# Patient Record
Sex: Female | Born: 1980 | Hispanic: Yes | Marital: Married | Smoking: Never smoker
Health system: Southern US, Community
[De-identification: ages and names within clinical notes are randomized; demographics above are authoritative.]

## PROBLEM LIST (undated history)

## (undated) DIAGNOSIS — R51 Headache: Secondary | ICD-10-CM

## (undated) DIAGNOSIS — R519 Headache, unspecified: Secondary | ICD-10-CM

## (undated) HISTORY — DX: Headache: R51

## (undated) HISTORY — PX: NO PAST SURGERIES: SHX2092

## (undated) HISTORY — DX: Headache, unspecified: R51.9

---

## 2013-07-18 ENCOUNTER — Emergency Department: Payer: Self-pay | Admitting: Emergency Medicine

## 2013-07-18 LAB — URINALYSIS, COMPLETE
BACTERIA: NONE SEEN
Bilirubin,UR: NEGATIVE
Glucose,UR: NEGATIVE mg/dL (ref 0–75)
KETONE: NEGATIVE
Nitrite: NEGATIVE
Ph: 5 (ref 4.5–8.0)
Protein: 30
Specific Gravity: 1.005 (ref 1.003–1.030)

## 2013-07-18 LAB — PREGNANCY, URINE: Pregnancy Test, Urine: NEGATIVE m[IU]/mL

## 2015-12-04 ENCOUNTER — Encounter: Payer: Self-pay | Admitting: Family Medicine

## 2015-12-04 ENCOUNTER — Ambulatory Visit (INDEPENDENT_AMBULATORY_CARE_PROVIDER_SITE_OTHER): Payer: Managed Care, Other (non HMO) | Admitting: Family Medicine

## 2015-12-04 VITALS — BP 130/82 | HR 88 | Temp 98.6°F | Ht 61.02 in | Wt 168.5 lb

## 2015-12-04 DIAGNOSIS — R51 Headache: Secondary | ICD-10-CM | POA: Diagnosis not present

## 2015-12-04 DIAGNOSIS — Z0001 Encounter for general adult medical examination with abnormal findings: Secondary | ICD-10-CM

## 2015-12-04 DIAGNOSIS — R6889 Other general symptoms and signs: Secondary | ICD-10-CM

## 2015-12-04 DIAGNOSIS — R519 Headache, unspecified: Secondary | ICD-10-CM

## 2015-12-04 MED ORDER — NORETHINDRONE ACET-ETHINYL EST 1.5-30 MG-MCG PO TABS: 1.0000 | ORAL_TABLET | Freq: Every day | ORAL | Status: DC

## 2015-12-04 NOTE — Progress Notes (Signed)
Pre visit review using our clinic review tool, if applicable. No additional management support is needed unless otherwise documented below in the visit note. 

## 2015-12-04 NOTE — Patient Instructions (Signed)
We will call with your referral.  You don't need a pap until 2019.  Follow up annually.  Take care  Dr. Adriana Simasook

## 2015-12-05 NOTE — Progress Notes (Signed)
Subjective:  Patient ID: Cindy King, female    DOB: 12/25/1980  Age: 35 y.o. MRN: [id number]  CC: Establish care, Birth control, Headaches   HPI Cindy King is a 35 y.o. female presents to the clinic today to establish care. Additional concerns are below.  Preventative Healthcare  Pap smear: 06/2014. Up to date.  Immunizations  Tetanus - Unsure of last Tetanus.   Exercise: Reports regular exercise.   Alcohol use: See below.  Smoking/tobacco use: Nonsmoker.  STD/HIV testing: Monogamous. Declines.  Contraception: Desires OCP's. Currently using condoms.  Headaches  Chronic.  Occur daily.  Located in the occipital region and on the top of the head.   Described as "electrical".  No associated photophobia, nausea, vomiting.  Moderate to severe.  Occur daily.  Ibuprofen provides short term improvement.   States she has seen 3 doctors for this in the past.   PMH, Surgical Hx, Family Hx, Social History reviewed and updated as below.  Past Medical History  Diagnosis Date  . Frequent headaches    Past Surgical History  Procedure Laterality Date  . No past surgeries     Family History  Problem Relation Age of Onset  . Diabetes Maternal Aunt   . Diabetes Maternal Uncle   . Diabetes Maternal Aunt   . Diabetes Maternal Aunt   . Diabetes Maternal Aunt   . Diabetes Maternal Uncle   . Diabetes Maternal Uncle   . Diabetes Maternal Uncle   . Diabetes Maternal Uncle    Social History  Substance Use Topics  . Smoking status: Never Smoker   . Smokeless tobacco: Never Used  . Alcohol Use: 0.0 - 0.6 oz/week    0-1 Standard drinks or equivalent per week    Review of Systems  Neurological: Positive for headaches.  All other systems reviewed and are negative.  Objective:   Today's Vitals: BP 130/82 mmHg  Pulse 88  Temp(Src) 98.6 F (37 C) (Oral)  Ht 5' 1.02" (1.55 m)  Wt 168 lb 8 oz (76.431 kg)  BMI 31.81 kg/m2  SpO2 98%  LMP 08/22/2015 (Exact  Date)  Physical Exam  Constitutional: She is oriented to person, place, and time. She appears well-developed and well-nourished. No distress.  HENT:  Head: Normocephalic and atraumatic.  Nose: Nose normal.  Mouth/Throat: Oropharynx is clear and moist. No oropharyngeal exudate.  Normal TM's bilaterally.   Eyes: Conjunctivae are normal. No scleral icterus.  Neck: Neck supple. No thyromegaly present.  Cardiovascular: Normal rate and regular rhythm.   No murmur heard. Pulmonary/Chest: Effort normal and breath sounds normal. She has no wheezes. She has no rales.  Abdominal: Soft. She exhibits no distension. There is no tenderness. There is no rebound and no guarding.  Musculoskeletal: Normal range of motion. She exhibits no edema.  Lymphadenopathy:    She has no cervical adenopathy.  Neurological: She is alert and oriented to person, place, and time.  Skin: Skin is warm and dry. No rash noted.  Psychiatric: She has a normal mood and affect.  Vitals reviewed.  Assessment & Plan:   Problem List Items Addressed This Visit    Encounter for preventative adult health care exam with abnormal findings - Primary    Pap up to date. Unsure about Tdap. Has had HIV screening with pregnancy. Desires OCPs. Rx sent.      Headache    New problem (to me). Unclear type of headache. Does not appear to be classic tension or migraine. Will continue PRN NSAID's. Referring  to neuro.      Relevant Orders   Ambulatory referral to Neurology      Outpatient Encounter Prescriptions as of 12/04/2015  Medication Sig  . Norethindrone Acetate-Ethinyl Estradiol (JUNEL,LOESTRIN,MICROGESTIN) 1.5-30 MG-MCG tablet Take 1 tablet by mouth daily. (Patient taking differently: Take 1 tablet by mouth daily. 28-day supply was given at pharmacy.)   No facility-administered encounter medications on file as of 12/04/2015.   Follow-up: Annually  Everlene Other DO [facility]

## 2015-12-06 DIAGNOSIS — Z0001 Encounter for general adult medical examination with abnormal findings: Secondary | ICD-10-CM | POA: Insufficient documentation

## 2015-12-06 DIAGNOSIS — R51 Headache: Secondary | ICD-10-CM

## 2015-12-06 DIAGNOSIS — R519 Headache, unspecified: Secondary | ICD-10-CM | POA: Insufficient documentation

## 2015-12-06 NOTE — Assessment & Plan Note (Signed)
New problem (to me). Unclear type of headache. Does not appear to be classic tension or migraine. Will continue PRN NSAID's. Referring to neuro.

## 2015-12-06 NOTE — Assessment & Plan Note (Signed)
Pap up to date. Unsure about Tdap. Has had HIV screening with pregnancy. Desires OCPs. Rx sent.

## 2015-12-19 ENCOUNTER — Encounter: Payer: Self-pay | Admitting: Emergency Medicine

## 2015-12-19 ENCOUNTER — Emergency Department
Admission: EM | Admit: 2015-12-19 | Discharge: 2015-12-19 | Disposition: A | Discharge status: Home / Self Care | Payer: Managed Care, Other (non HMO) | Attending: Student | Admitting: Student

## 2015-12-19 DIAGNOSIS — H5712 Ocular pain, left eye: Secondary | ICD-10-CM | POA: Diagnosis present

## 2015-12-19 DIAGNOSIS — H5789 Other specified disorders of eye and adnexa: Secondary | ICD-10-CM

## 2015-12-19 DIAGNOSIS — H578 Other specified disorders of eye and adnexa: Secondary | ICD-10-CM | POA: Diagnosis not present

## 2015-12-19 MED ORDER — TETRACAINE HCL 0.5 % OP SOLN
2.0000 [drp] | Freq: Once | OPHTHALMIC | Status: AC
  Administered 2015-12-19: 2 [drp] via OPHTHALMIC

## 2015-12-19 MED ORDER — KETOROLAC TROMETHAMINE 0.5 % OP SOLN: 1.0000 [drp] | Freq: Four times a day (QID) | OPHTHALMIC | Status: DC

## 2015-12-19 MED ORDER — TETRACAINE HCL 0.5 % OP SOLN
OPHTHALMIC | Status: AC
  Filled 2015-12-19: qty 2

## 2015-12-19 NOTE — ED Notes (Signed)
Patient states it took one hour to get her contact out of her left eye. Patient states they attempted to flush the left eye at the reception.

## 2015-12-19 NOTE — ED Notes (Signed)
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

## 2015-12-19 NOTE — ED Notes (Signed)
Patient reports relief of Left eye pain.

## 2015-12-19 NOTE — ED Notes (Signed)
Pt discharged - husband signed discharge for pt

## 2015-12-19 NOTE — Discharge Instructions (Signed)
How to Use Eye Drops and Eye Ointments  HOW TO APPLY EYE DROPS  Follow these steps when applying eye drops:  1. Wash your hands.  2. Tilt your head back.  3. Put a finger under your eye and use it to gently pull your lower lid downward. Keep that finger in place.  4. Using your other hand, hold the dropper between your thumb and index finger.  5. Position the dropper just over the edge of the lower lid. Hold it as close to your eye as you can without touching the dropper to your eye.  6. Steady your hand. One way to do this is to lean your index finger against your brow.  7. Look up.  8. Slowly and gently squeeze one drop of medicine into your eye.  9. Close your eye.  10. Place a finger between your lower eyelid and your nose. Press gently for 2 minutes. This increases the amount of time that the medicine is exposed to the eye. It also reduces side effects that can develop if the drop gets into the bloodstream through the nose.  HOW TO APPLY EYE OINTMENTS  Follow these steps when applying eye ointments:  1. Wash your hands.  2. Put a finger under your eye and use it to gently pull your lower lid downward. Keep that finger in place.  3. Using your other hand, place the tip of the tube between your thumb and index finger with the remaining fingers braced against your cheek or nose.  4. Hold the tube just over the edge of your lower lid without touching the tube to your lid or eyeball.  5. Look up.  6. Line the inner part of your lower lid with ointment.  7. Gently pull up on your upper lid and look down. This will force the ointment to spread over the surface of the eye.  8. Release the upper lid.  9. If you can, close your eyes for 1-2 minutes.  Do not rub your eyes. If you applied the ointment correctly, your vision will be blurry for a few minutes. This is normal.  ADDITIONAL INFORMATION   Make sure to use the eye drops or ointment as told by your health care provider.   If you have been told to use both eye  drops and an eye ointment, apply the eye drops first, then wait 3-4 minutes before you apply the ointment.   Try not to touch the tip of the dropper or tube to your eye. A dropper or tube that has touched the eye can become contaminated.     This information is not intended to replace advice given to you by your health care provider. Make sure you discuss any questions you have with your health care provider.     Document Released: 09/19/2000 Document Revised: 10/28/2014 Document Reviewed: 06/09/2014  Elsevier Interactive Patient Education 2016 [employer]

## 2015-12-19 NOTE — ED Provider Notes (Signed)
[facility] Emergency Department Provider Note  ____________________________________________  Time seen: Approximately 8:09 PM  I have reviewed the triage vital signs and the nursing notes.   HISTORY  Chief Complaint Burning Eyes    HPI Cindy King is a 35 y.o. female who presents emergency department complaining of left eye pain. Patient states that she was opening a jar of jalapenos once on the juice splash into her eye. She states that it was a burning sensation to her eye. She had excessive watering of her eye. She does wear contacts but was able to remove the lens. She denies any trauma to the eye. She endorses burning and excessive watering status post having her period juice/into the eye. She denies any visual changes. She denies any other complaints at this time.   Past Medical History  Diagnosis Date  . Frequent headaches     Patient Active Problem List   Diagnosis Date Noted  . Headache 12/06/2015  . Encounter for preventative adult health care exam with abnormal findings 12/06/2015    Past Surgical History  Procedure Laterality Date  . No past surgeries      Current Outpatient Rx  Name  Route  Sig  Dispense  Refill  . ketorolac (ACULAR) 0.5 % ophthalmic solution   Right Eye   Place 1 drop into the right eye 4 (four) times daily.   5 mL   0   . Norethindrone Acetate-Ethinyl Estradiol (JUNEL,LOESTRIN,MICROGESTIN) 1.5-30 MG-MCG tablet   Oral   Take 1 tablet by mouth daily. Patient taking differently: Take 1 tablet by mouth daily. 28-day supply was given at pharmacy.   1 Package   11     Allergies Review of patient's allergies indicates no known allergies.  Family History  Problem Relation Age of Onset  . Diabetes Maternal Aunt   . Diabetes Maternal Uncle   . Diabetes Maternal Aunt   . Diabetes Maternal Aunt   . Diabetes Maternal Aunt   . Diabetes Maternal Uncle   . Diabetes Maternal Uncle   . Diabetes Maternal Uncle    . Diabetes Maternal Uncle     Social History Social History  Substance Use Topics  . Smoking status: Never Smoker   . Smokeless tobacco: Never Used  . Alcohol Use: 0.0 - 0.6 oz/week    0-1 Standard drinks or equivalent per week     Review of Systems  Constitutional: No fever/chills Eyes: No visual changes. No discharge. Positive for left eye burning and lacrimation ENT: No upper respiratory complaints. Cardiovascular: no chest pain. Respiratory: no cough. No SOB. Skin: Negative for rash, abrasions, lacerations, ecchymosis. Neurological: Negative for headaches, focal weakness or numbness. 10-point ROS otherwise negative.  ____________________________________________   PHYSICAL EXAM:  VITAL SIGNS: ED Triage Vitals  Enc Vitals Group     BP 12/19/15 1933 151/86 mmHg     Pulse Rate 12/19/15 1933 98     Resp 12/19/15 1933 20     Temp 12/19/15 1933 97.9 F (36.6 C)     Temp Source 12/19/15 1933 Oral     SpO2 12/19/15 1933 100 %     Weight 12/19/15 1933 165 lb (74.844 kg)     Height 12/19/15 1933 5\' 2"  (1.575 m)     Head Cir --      Peak Flow --      Pain Score 12/19/15 1934 10     Pain Loc --      Pain Edu? --  Excl. in GC? --      Constitutional: Alert and oriented. Well appearing and in no acute distress. Eyes: Is slightly erythematous on the left when compared with right. Funduscopic exam reveals no acute abnormality, red reflex intact, vasculature and optic disc unremarkable.Marland Kitchen. PERRL. EOMI. Head: Atraumatic. Neck: No stridor.    Cardiovascular: Normal rate, regular rhythm. Normal S1 and S2.  Good peripheral circulation. Respiratory: Normal respiratory effort without tachypnea or retractions. Lungs CTAB. Good air entry to the bases with no decreased or absent breath sounds. Musculoskeletal: Full range of motion to all extremities. No gross deformities appreciated. Neurologic:  Normal speech and language. No gross focal neurologic deficits are appreciated.   Skin:  Skin is warm, dry and intact. No rash noted. Psychiatric: Mood and affect are normal. Speech and behavior are normal. Patient exhibits appropriate insight and judgement.   ____________________________________________   LABS (all labs ordered are listed, but only abnormal results are displayed)  Labs Reviewed - No data to display ____________________________________________  EKG   ____________________________________________  RADIOLOGY   No results found.  ____________________________________________    PROCEDURES  Procedure(s) performed:       Medications  tetracaine (PONTOCAINE) 0.5 % ophthalmic solution 2 drop (2 drops Right Eye Given 12/19/15 1945)  tetracaine (PONTOCAINE) 0.5 % ophthalmic solution 2 drop (2 drops Left Eye Given 12/19/15 1950)     ____________________________________________   INITIAL IMPRESSION / ASSESSMENT AND PLAN / ED COURSE  Pertinent labs & imaging results that were available during my care of the patient were reviewed by me and considered in my medical decision making (see chart for details).  Patient's diagnosis is consistent with Eye pain from exposure to all pain allergies. Patient was given tetracaine in the emergency department with complete resolution of symptoms. Funduscopic exam reveals no abnormalities. No visual changes. Patient will be discharged home with prescriptions for Acular for symptom relief should sensation return.. Patient is advised to not use contacts over the next day or 2. Patient has any further symptoms she'll follow-up with ophthalmology. Patient is given ED precautions to return to the ED for any worsening or new symptoms.     ____________________________________________  FINAL CLINICAL IMPRESSION(S) / ED DIAGNOSES  Final diagnoses:  Irritation of eye      NEW MEDICATIONS STARTED DURING THIS VISIT:  New Prescriptions   KETOROLAC (ACULAR) 0.5 % OPHTHALMIC SOLUTION    Place 1 drop into the  right eye 4 (four) times daily.        This chart was dictated using voice recognition software/Dragon. Despite best efforts to proofread, errors can occur which can change the meaning. Any change was purely unintentional.    Racheal PatchesJonathan D Cuthriell, PA-C 12/19/15 2017  Gayla DossEryka A Gayle, MD 12/20/15 [phone]

## 2018-04-10 ENCOUNTER — Ambulatory Visit
Admission: RE | Admit: 2018-04-10 | Discharge: 2018-04-10 | Disposition: A | Discharge status: Home / Self Care | Payer: 59 | Source: Ambulatory Visit | Attending: Family Medicine | Admitting: Family Medicine

## 2018-04-10 ENCOUNTER — Other Ambulatory Visit: Payer: Self-pay | Admitting: Family Medicine

## 2018-04-10 DIAGNOSIS — M545 Low back pain, unspecified: Secondary | ICD-10-CM

## 2018-04-10 DIAGNOSIS — M4317 Spondylolisthesis, lumbosacral region: Secondary | ICD-10-CM | POA: Insufficient documentation

## 2019-02-09 ENCOUNTER — Other Ambulatory Visit: Payer: Self-pay | Admitting: Radiology

## 2019-02-09 DIAGNOSIS — Z20822 Contact with and (suspected) exposure to covid-19: Secondary | ICD-10-CM

## 2019-02-10 LAB — NOVEL CORONAVIRUS, NAA: SARS-CoV-2, NAA: NOT DETECTED

## 2019-11-12 ENCOUNTER — Other Ambulatory Visit: Payer: Self-pay | Admitting: Physician Assistant

## 2019-11-12 DIAGNOSIS — Z1231 Encounter for screening mammogram for malignant neoplasm of breast: Secondary | ICD-10-CM

## 2019-11-21 DIAGNOSIS — I10 Essential (primary) hypertension: Secondary | ICD-10-CM | POA: Insufficient documentation

## 2019-12-09 ENCOUNTER — Ambulatory Visit
Admission: RE | Admit: 2019-12-09 | Discharge: 2019-12-09 | Disposition: A | Discharge status: Home / Self Care | Payer: 59 | Source: Ambulatory Visit | Attending: Physician Assistant | Admitting: Physician Assistant

## 2019-12-09 DIAGNOSIS — Z1231 Encounter for screening mammogram for malignant neoplasm of breast: Secondary | ICD-10-CM | POA: Insufficient documentation

## 2020-09-28 ENCOUNTER — Emergency Department
Admission: EM | Admit: 2020-09-28 | Discharge: 2020-09-28 | Disposition: A | Discharge status: Home / Self Care | Payer: 59 | Attending: Emergency Medicine | Admitting: Emergency Medicine

## 2020-09-28 ENCOUNTER — Emergency Department: Payer: 59

## 2020-09-28 ENCOUNTER — Other Ambulatory Visit: Payer: Self-pay

## 2020-09-28 DIAGNOSIS — I10 Essential (primary) hypertension: Secondary | ICD-10-CM | POA: Diagnosis not present

## 2020-09-28 DIAGNOSIS — R202 Paresthesia of skin: Secondary | ICD-10-CM | POA: Insufficient documentation

## 2020-09-28 DIAGNOSIS — R519 Headache, unspecified: Secondary | ICD-10-CM | POA: Diagnosis present

## 2020-09-28 DIAGNOSIS — G43909 Migraine, unspecified, not intractable, without status migrainosus: Secondary | ICD-10-CM

## 2020-09-28 LAB — CBC
HCT: 40.8 % (ref 36.0–46.0)
Hemoglobin: 13.5 g/dL (ref 12.0–15.0)
MCH: 27.2 pg (ref 26.0–34.0)
MCHC: 33.1 g/dL (ref 30.0–36.0)
MCV: 82.3 fL (ref 80.0–100.0)
Platelets: 379 10*3/uL (ref 150–400)
RBC: 4.96 MIL/uL (ref 3.87–5.11)
RDW: 13.9 % (ref 11.5–15.5)
WBC: 8 10*3/uL (ref 4.0–10.5)
nRBC: 0 % (ref 0.0–0.2)

## 2020-09-28 LAB — COMPREHENSIVE METABOLIC PANEL
ALT: 21 U/L (ref 0–44)
AST: 21 U/L (ref 15–41)
Albumin: 4.5 g/dL (ref 3.5–5.0)
Alkaline Phosphatase: 83 U/L (ref 38–126)
Anion gap: 9 (ref 5–15)
BUN: 8 mg/dL (ref 6–20)
CO2: 25 mmol/L (ref 22–32)
Calcium: 9.3 mg/dL (ref 8.9–10.3)
Chloride: 107 mmol/L (ref 98–111)
Creatinine, Ser: 0.58 mg/dL (ref 0.44–1.00)
GFR, Estimated: >60 mL/min (ref >60)
Glucose, Bld: 106 mg/dL — ABNORMAL HIGH (ref 70–99)
Potassium: 4 mmol/L (ref 3.5–5.1)
Sodium: 141 mmol/L (ref 135–145)
Total Bilirubin: 0.7 mg/dL (ref 0.3–1.2)
Total Protein: 7.8 g/dL (ref 6.5–8.1)

## 2020-09-28 MED ORDER — METOCLOPRAMIDE HCL 5 MG/ML IJ SOLN
10.0000 mg | Freq: Once | INTRAMUSCULAR | Status: AC
  Administered 2020-09-28: 10 mg via INTRAVENOUS
  Filled 2020-09-28: qty 2

## 2020-09-28 MED ORDER — SODIUM CHLORIDE 0.9 % IV BOLUS
1000.0000 mL | Freq: Once | INTRAVENOUS | Status: AC
  Administered 2020-09-28: 1000 mL via INTRAVENOUS

## 2020-09-28 MED ORDER — DIPHENHYDRAMINE HCL 50 MG/ML IJ SOLN
50.0000 mg | Freq: Once | INTRAMUSCULAR | Status: AC
  Administered 2020-09-28: 50 mg via INTRAVENOUS
  Filled 2020-09-28: qty 1

## 2020-09-28 MED ORDER — KETOROLAC TROMETHAMINE 30 MG/ML IJ SOLN
30.0000 mg | Freq: Once | INTRAMUSCULAR | Status: AC
  Administered 2020-09-28: 30 mg via INTRAVENOUS
  Filled 2020-09-28: qty 1

## 2020-09-28 NOTE — ED Provider Notes (Signed)
[facility] Emergency Department Provider Note  Time seen: 3:39 PM  I have reviewed the triage vital signs and the nursing notes.   HISTORY  Chief Complaint Numbness   HPI Cindy King is a 40 y.o. female with a past medical history of headaches who presents to the emergency department for high blood pressure headache and paresthesias.  According to the patient this morning she checked her blood pressure and it was 160 systolic, states this concerned her she tried to drink water and check it again it was down to 150.  Patient states she was having a headache which she describes as more of an occipital headache on both sides as well as tingling sensation in her right face.  Patient states she went back to work however this afternoon she felt like the headache worsen so she checked her blood pressure again it was back up to 160 she began feeling tingling sensation on the right face again so she came to the emergency department for evaluation.  Patient denies any weakness or numbness of any arm or leg confusion or slurred speech.  States moderate headache currently.   Past Medical History:  Diagnosis Date  . Frequent headaches     Patient Active Problem List   Diagnosis Date Noted  . Headache 12/06/2015  . Encounter for preventative adult health care exam with abnormal findings 12/06/2015    Past Surgical History:  Procedure Laterality Date  . NO PAST SURGERIES      Prior to Admission medications   Medication Sig Start Date End Date Taking? Authorizing Provider  ketorolac (ACULAR) 0.5 % ophthalmic solution Place 1 drop into the right eye 4 (four) times daily. 12/19/15   Cuthriell, Delorise Royals, PA-C  Norethindrone Acetate-Ethinyl Estradiol (JUNEL,LOESTRIN,MICROGESTIN) 1.5-30 MG-MCG tablet Take 1 tablet by mouth daily. Patient taking differently: Take 1 tablet by mouth daily. 28-day supply was given at pharmacy. 12/04/15   Tommie Sams, DO    No Known  Allergies  Family History  Problem Relation Age of Onset  . Diabetes Maternal Aunt   . Diabetes Maternal Uncle   . Diabetes Maternal Aunt   . Diabetes Maternal Aunt   . Diabetes Maternal Aunt   . Diabetes Maternal Uncle   . Diabetes Maternal Uncle   . Diabetes Maternal Uncle   . Diabetes Maternal Uncle     Social History Social History   Tobacco Use  . Smoking status: Never Smoker  . Smokeless tobacco: Never Used  Substance Use Topics  . Alcohol use: Yes    Alcohol/week: 0.0 - 1.0 standard drinks  . Drug use: No    Review of Systems Constitutional: Negative for fever. Cardiovascular: Negative for chest pain. Respiratory: Negative for shortness of breath. Gastrointestinal: Negative for abdominal pain, vomiting  Musculoskeletal: Negative for musculoskeletal complaints Neurological: Moderate headache.  Tingling on the right face. All other ROS negative  ____________________________________________   PHYSICAL EXAM:  VITAL SIGNS: ED Triage Vitals [09/28/20 1508]  Enc Vitals Group     BP (!) 154/106     Pulse Rate 74     Resp 20     Temp 98.7 F (37.1 C)     Temp Source Oral     SpO2 100 %     Weight 176 lb (79.8 kg)     Height 5\' 2"  (1.575 m)     Head Circumference      Peak Flow      Pain Score 6  Pain Loc      Pain Edu?      Excl. in GC?     Constitutional: Alert and oriented.  Moderately anxious appearing. Eyes: Normal exam ENT      Head: Normocephalic and atraumatic.      Mouth/Throat: Mucous membranes are moist. Cardiovascular: Normal rate, regular rhythm.  Respiratory: Normal respiratory effort without tachypnea nor retractions. Breath sounds are clear Gastrointestinal: Soft and nontender. No distention.   Musculoskeletal: Nontender with normal range of motion in all extremities.  Neurologic:  Normal speech and language. No gross focal neurologic deficits.  Equal grip strength bilaterally.  5/5 motor in all extremities.  No pronator drift.  No  lower extremity drift.  Cranial nerves intact.  Subjective sensation intact and equal bilaterally in face arms and legs. Skin:  Skin is warm, dry and intact.  Psychiatric: Mood and affect are normal.   ____________________________________________    EKG  EKG viewed and interpreted by myself shows a normal sinus rhythm at 59 bpm with a narrow QRS, normal axis, normal intervals, nonspecific but no concerning ST changes.  ____________________________________________    RADIOLOGY  CT scan of the head is negative.  ____________________________________________   INITIAL IMPRESSION / ASSESSMENT AND PLAN / ED COURSE  Pertinent labs & imaging results that were available during my care of the patient were reviewed by me and considered in my medical decision making (see chart for details).   Patient presents to the emergency department for hypertension headache and right-sided facial tingling.  Patient states a history of headaches, her bigger concern is her high blood pressure today.  States she was on blood pressure medications previously but was taken off by her doctor 6 months ago as her blood pressure has been improving.  Currently patient appears well, intact neurological exam.  We will check labs, CT scan of the head, treat the patient's headache IV hydrate and reassess.  Patient agreeable to plan of care.  Patient CT scan is negative.  Patient states she feels much better after medications.  Headache is gone, numbness is gone.  Suspect likely migraine leading to hypertension and paresthesia.  The patient appears well will discharge home with PCP follow-up.  Patient agreeable to plan of care.  Cindy King was evaluated in Emergency Department on 09/28/2020 for the symptoms described in the history of present illness. She was evaluated in the context of the global COVID-19 pandemic, which necessitated consideration that the patient might be at risk for infection with the SARS-CoV-2 virus that  causes COVID-19. Institutional protocols and algorithms that pertain to the evaluation of patients at risk for COVID-19 are in a state of rapid change based on information released by regulatory bodies including the CDC and federal and state organizations. These policies and algorithms were followed during the patient's care in the ED.  ____________________________________________   FINAL CLINICAL IMPRESSION(S) / ED DIAGNOSES  Hypertension Headache Paresthesias   Minna Antis, MD 09/28/20 1750

## 2020-09-28 NOTE — ED Triage Notes (Addendum)
Pt in with co right facial numbness that started that started at 0900, states also has generalized weakness. Pt does have hx of htn but pmd took her off bp meds months ago. No deficits noted to face at this time. Pt also co headache at this time.

## 2022-10-02 IMAGING — CT CT HEAD W/O CM
3 series · 16 of 46 positions shown, 19 images · non-contrast
Comparison: None.

CLINICAL DATA: Right facial numbness beginning this morning.
Generalized weakness.

EXAM:
CT HEAD WITHOUT CONTRAST
TECHNIQUE: Contiguous axial images were obtained from the base of the skull
through the vertex without intravenous contrast.

[Series 2: head wo · axial · 0.42mm/px · z∈[-140,-20]mm · 10 of 29 slices shown, 13 images]
[im 3/29  brain]
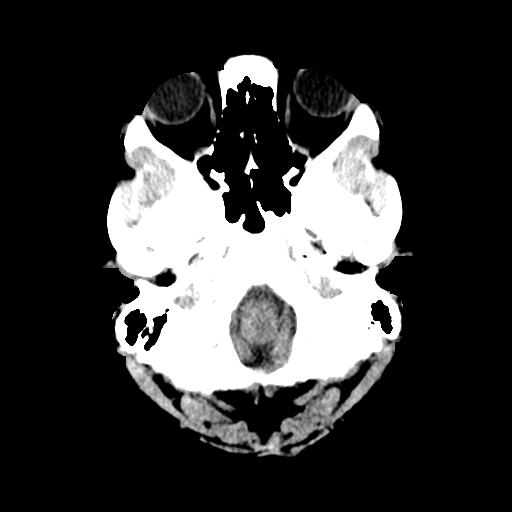
[im 3/29  bone]
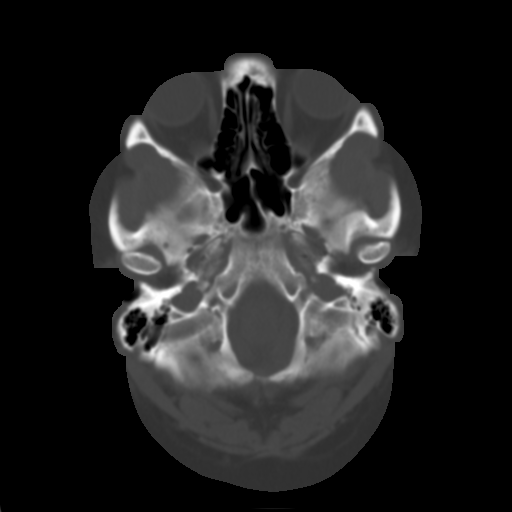
[im 6/29  brain]
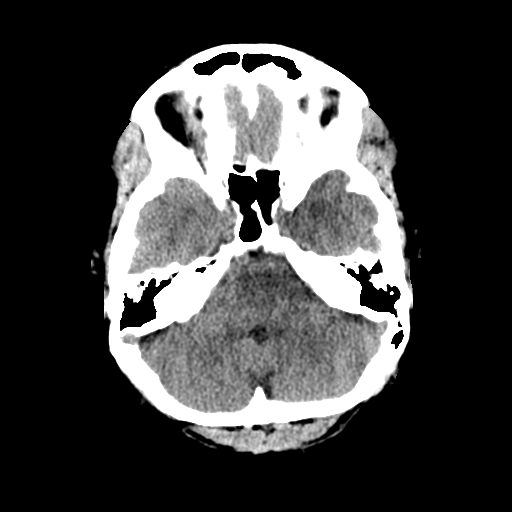
[im 8/29  brain]
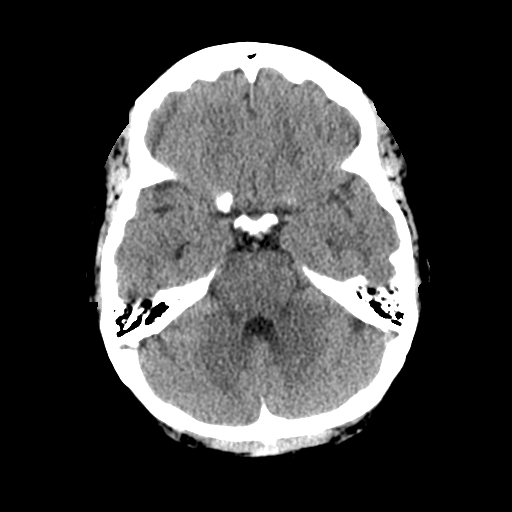
[im 11/29  brain]
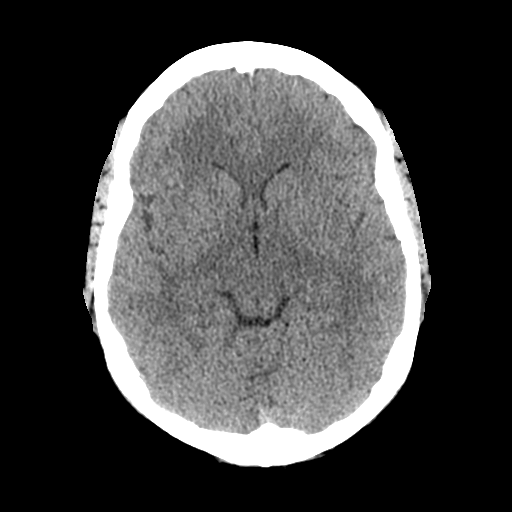
[im 14/29  brain]
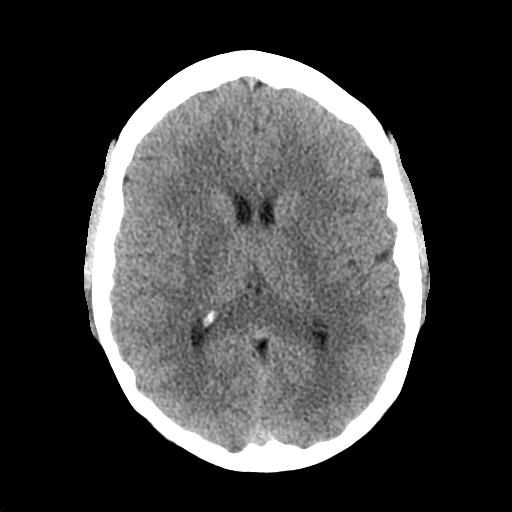
[im 14/29  bone]
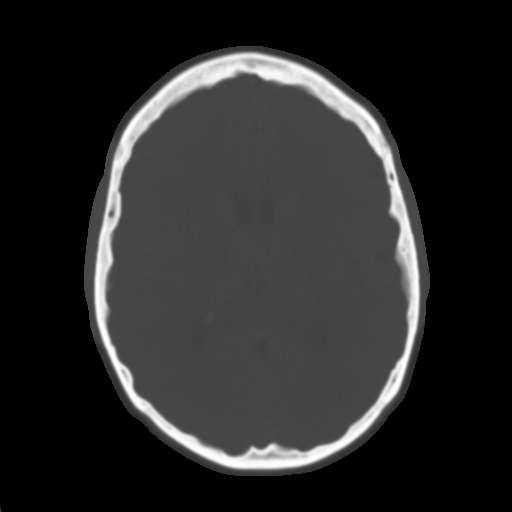
[im 16/29  brain]
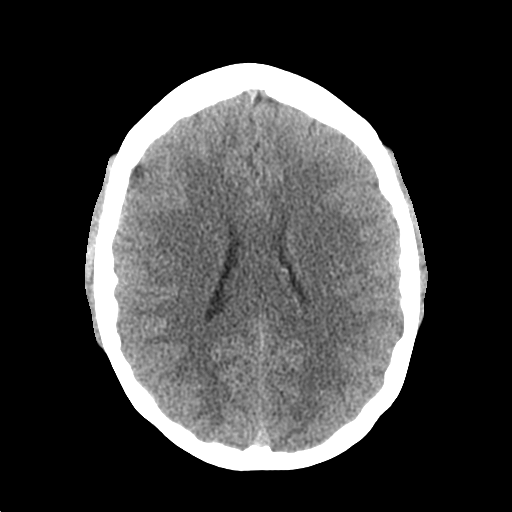
[im 19/29  brain]
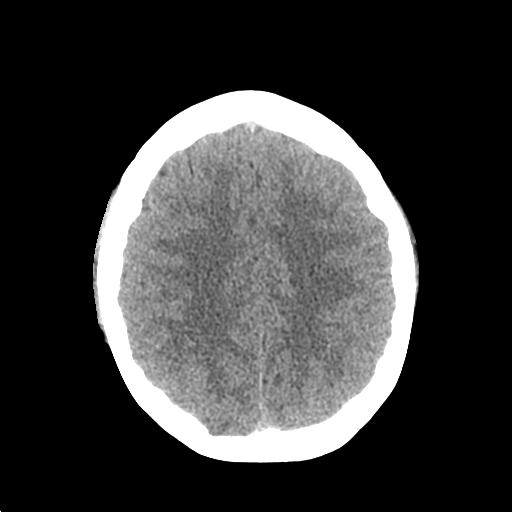
[im 22/29  brain]
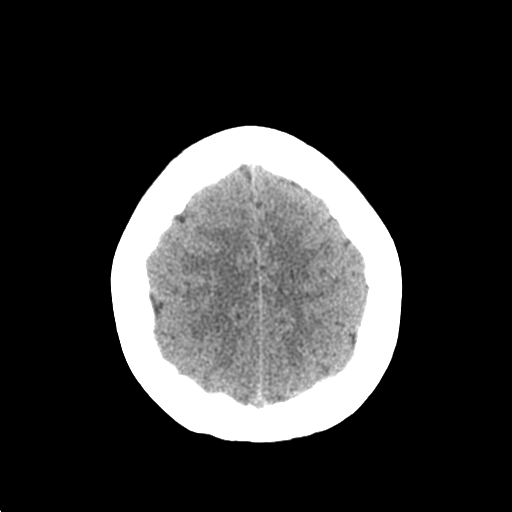
[im 24/29  brain]
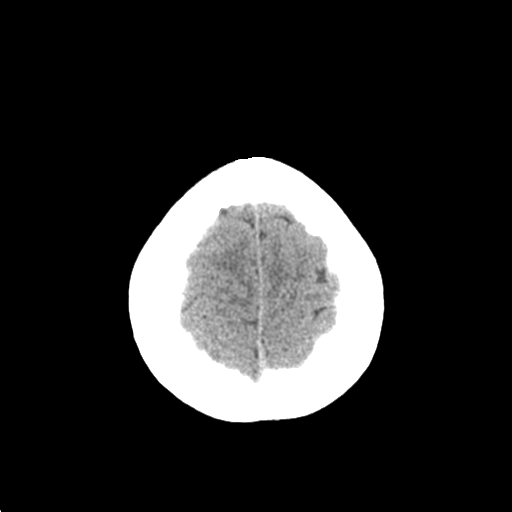
[im 24/29  bone]
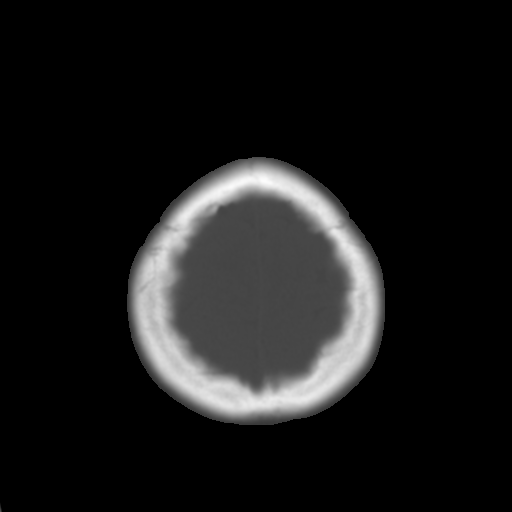
[im 27/29  brain]
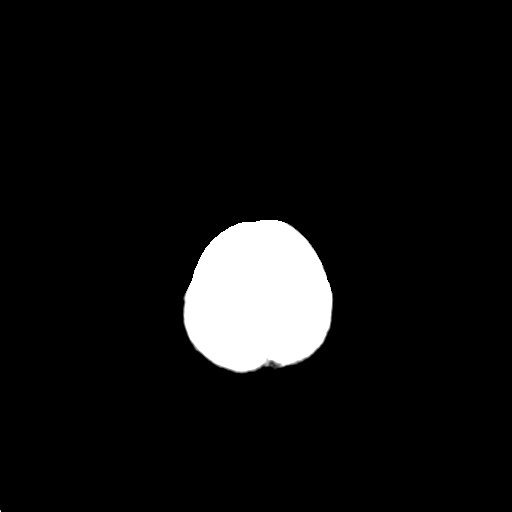

[Series 4: coronal soft tissue · coronal · 0.30mm/px · 3 of 64 slices shown]
[im 22/64  brain]
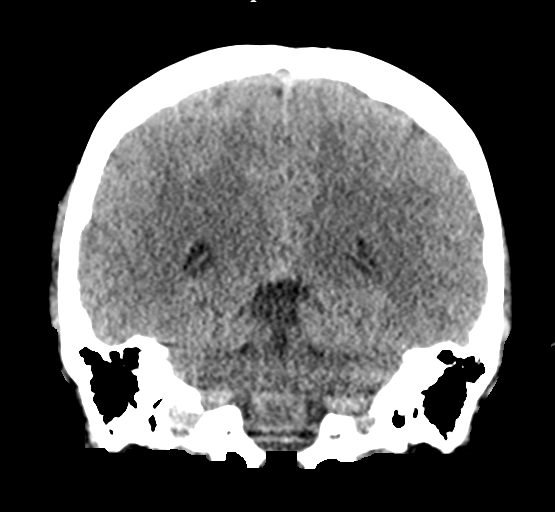
[im 29/64  brain]
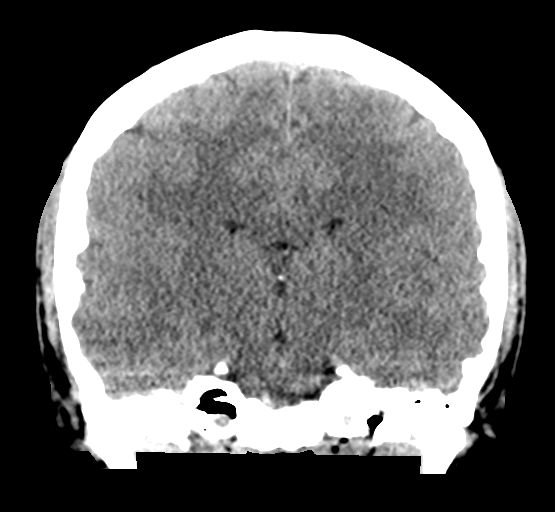
[im 36/64  brain]
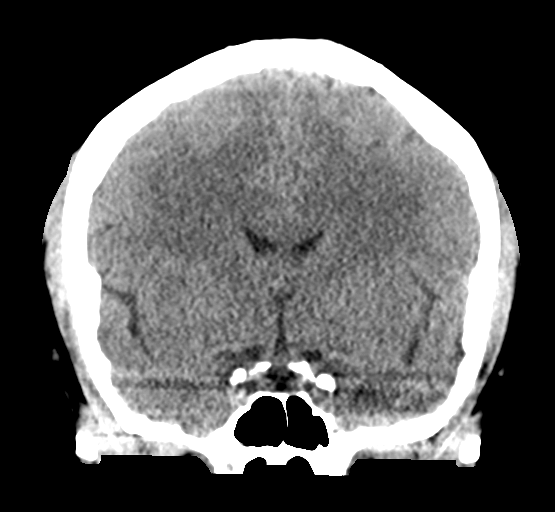

[Series 5: sagittal soft tissue · sagittal · 0.30mm/px · 3 of 57 slices shown]
[im 19/57  brain]
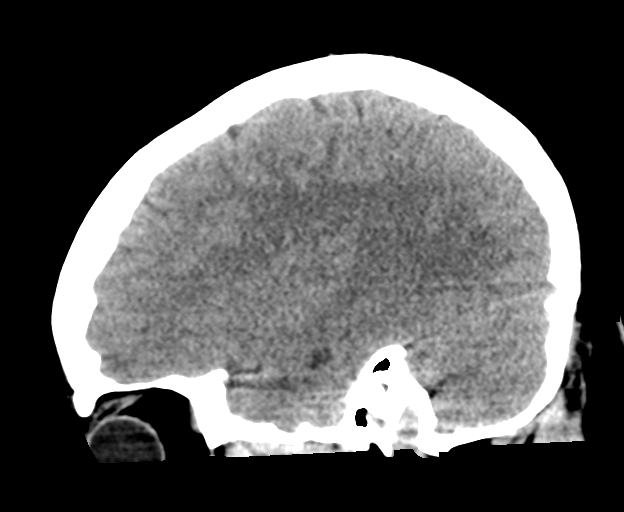
[im 29/57  brain]
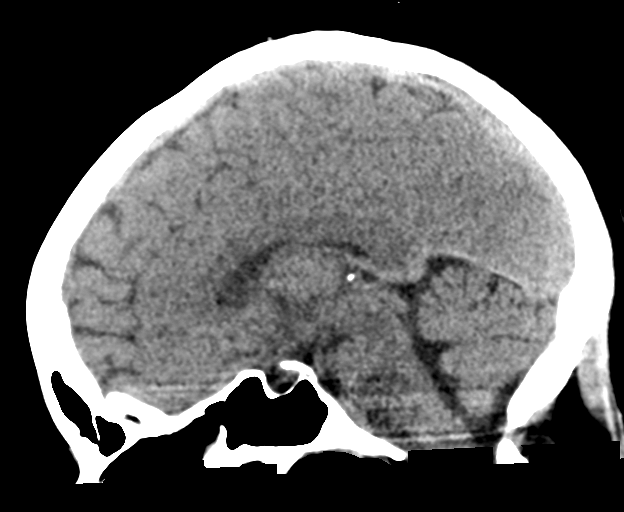
[im 38/57  brain]
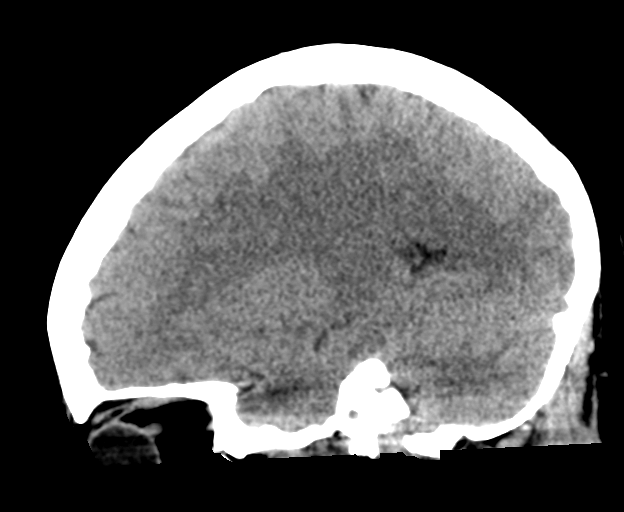

[16 of 46 positions shown; findings below may reference images not displayed]

FINDINGS: Brain: There is no evidence of an acute infarct, intracranial
hemorrhage, mass, midline shift, or extra-axial fluid collection.
The ventricles and sulci are normal.

Vascular: No hyperdense vessel.

Skull: No fracture or suspicious osseous lesion.

Sinuses/Orbits: Visualized paranasal sinuses and mastoid air cells
are clear. Visualized orbits are unremarkable.

Other: None.
IMPRESSION: Negative head CT.

## 2022-11-01 NOTE — Progress Notes (Signed)
Sleep Medicine   Office Visit  Patient Name: Cindy King DOB: 03/14/1981 MRN [id number]    Chief Complaint: Sleep consult  Brief History:  Lutitia presents for an initial consult for sleep evaluation and to establish care. Patient has at least 10 years history of snoring. Sleep quality is fair. This is noted most nights. The patient's bed partner reports  snoring, gasping, and apneic pauses at night. The patient relates the following symptoms: headaches, trouble concentrating, some brain fogginess, some fatigue are also present. The patient goes to sleep at 1000 pm and wakes up at 0530 am.  Sleep quality is the same when outside home environment.  Patient has noted no significant movement of her legs at night that would disrupt her sleep.  The patient  relates no unusual behavior during the night.  The patient relates no history of psychiatric problems. The Epworth Sleepiness Score is 7 out of 24 .  The patient relates  Cardiovascular risk factors include: HTN.   ROS  General: (-) fever, (-) chills, (-) night sweat Nose and Sinuses: (-) nasal stuffiness or itchiness, (-) postnasal drip, (-) nosebleeds, (-) sinus trouble. Mouth and Throat: (-) sore throat, (-) hoarseness. Neck: (-) swollen glands, (-) enlarged thyroid, (-) neck pain. Respiratory: - cough, - shortness of breath, - wheezing. Neurologic: - numbness, - tingling. Psychiatric: - anxiety, - depression Sleep behavior: -sleep paralysis -hypnogogic hallucinations -dream enactment      -vivid dreams -cataplexy -night terrors -sleep walking   Current Medication: Outpatient Encounter Medications as of 11/02/2022  Medication Sig   levonorgestrel (MIRENA) 20 MCG/DAY IUD 1 each by Intrauterine route once.   hydrochlorothiazide (HYDRODIURIL) 25 MG tablet Take 25 mg by mouth daily.   [DISCONTINUED] ketorolac (ACULAR) 0.5 % ophthalmic solution Place 1 drop into the right eye 4 (four) times daily.   [DISCONTINUED] Norethindrone  Acetate-Ethinyl Estradiol (JUNEL,LOESTRIN,MICROGESTIN) 1.5-30 MG-MCG tablet Take 1 tablet by mouth daily. (Patient taking differently: Take 1 tablet by mouth daily. 28-day supply was given at pharmacy.)   No facility-administered encounter medications on file as of 11/02/2022.    Surgical History: Past Surgical History:  Procedure Laterality Date   NO PAST SURGERIES      Medical History: Past Medical History:  Diagnosis Date   Frequent headaches     Family History: Non contributory to the present illness  Social History: Social History   Socioeconomic History   Marital status: Married    Spouse name: Not on file   Number of children: Not on file   Years of education: Not on file   Highest education level: Not on file  Occupational History   Not on file  Tobacco Use   Smoking status: Never   Smokeless tobacco: Never  Substance and Sexual Activity   Alcohol use: Yes    Alcohol/week: 0.0 - 1.0 standard drinks of alcohol   Drug use: No   Sexual activity: Yes  Other Topics Concern   Not on file  Social History Narrative   Not on file   Social Determinants of Health   Financial Resource Strain: Not on file  Food Insecurity: Not on file  Transportation Needs: Not on file  Physical Activity: Not on file  Stress: Not on file  Social Connections: Not on file  Intimate Partner Violence: Not on file    Vital Signs: Blood pressure (!) 148/103, pulse 83, resp. rate 18, height 5\' 2"  (1.575 m), weight 174 lb (78.9 kg), SpO2 96 %. Body mass index is 31.83 kg/m.  Examination: General Appearance: The patient is well-developed, well-nourished, and in no distress. Neck Circumference: 36 cm Skin: Gross inspection of skin unremarkable. Head: normocephalic, no gross deformities. Eyes: no gross deformities noted. ENT: ears appear grossly normal Neurologic: Alert and oriented. No involuntary movements.    STOP BANG RISK ASSESSMENT S (snore) Have you been told that you  snore?     YES   T (tired) Are you often tired, fatigued, or sleepy during the day?   YES  O (obstruction) Do you stop breathing, choke, or gasp during sleep? YES   P (pressure) Do you have or are you being treated for high blood pressure? YES   B (BMI) Is your body index greater than 35 kg/m? NO   A (age) Are you 68 years old or older? NO   N (neck) Do you have a neck circumference greater than 16 inches?   NO   G (gender) Are you a female? NO   TOTAL STOP/BANG "YES" ANSWERS 4                                                               A STOP-Bang score of 2 or less is considered low risk, and a score of 5 or more is high risk for having either moderate or severe OSA. For people who score 3 or 4, doctors may need to perform further assessment to determine how likely they are to have OSA.         EPWORTH SLEEPINESS SCALE:  Scale:  (0)= no chance of dozing; (1)= slight chance of dozing; (2)= moderate chance of dozing; (3)= high chance of dozing  Chance  Situtation    Sitting and reading: 1    Watching TV: 1    Sitting Inactive in public: 0    As a passenger in car: 3      Lying down to rest: 2    Sitting and talking: 0    Sitting quielty after lunch: 0    In a car, stopped in traffic: 0   TOTAL SCORE:   7 out of 24    SLEEP STUDIES:  None   LABS: No results found for this or any previous visit (from the past 2160 hour(s)).  Radiology: CT Head Wo Contrast  Result Date: 09/28/2020 CLINICAL DATA:  Right facial numbness beginning this morning. Generalized weakness. EXAM: CT HEAD WITHOUT CONTRAST TECHNIQUE: Contiguous axial images were obtained from the base of the skull through the vertex without intravenous contrast. COMPARISON:  None. FINDINGS: Brain: There is no evidence of an acute infarct, intracranial hemorrhage, mass, midline shift, or extra-axial fluid collection. The ventricles and sulci are normal. Vascular: No hyperdense vessel. Skull: No fracture  or suspicious osseous lesion. Sinuses/Orbits: Visualized paranasal sinuses and mastoid air cells are clear. Visualized orbits are unremarkable. Other: None. IMPRESSION: Negative head CT. Electronically Signed   By: Sebastian Ache M.D.   On: 09/28/2020 16:26    No results found.  No results found.    Assessment and Plan: Patient Active Problem List   Diagnosis Date Noted   Benign essential HTN 11/21/2019   Headache 12/06/2015   Encounter for preventative adult health care exam with abnormal findings 12/06/2015     PLAN OSA:   Patient evaluation suggests high risk  of sleep disordered breathing due to snoring, gasping, apneic pauses, headaches, trouble concentrating, some brain fogginess, some fatigue, and elevated BMI. Patient has comorbid cardiovascular risk factors including: HTN which could be exacerbated by pathologic sleep-disordered breathing.  Suggest: PSG assess/treat the patient's sleep disordered breathing. The patient was also counselled on wt loss to optimize sleep health.   1. Hypersomnia Will order PSG  2. Benign essential HTN Continue current medication and f/u with PCP.  3. Obesity (BMI 30.0-34.9) Obesity Counseling: Had a lengthy discussion regarding patients BMI and weight issues. Patient was instructed on portion control as well as increased activity. Also discussed caloric restrictions with trying to maintain intake less than 2000 Kcal. Discussions were made in accordance with the 5As of weight management. Simple actions such as not eating late and if able to, taking a walk is suggested.    General Counseling: I have discussed the findings of the evaluation and examination with Leianne.  I have also discussed any further diagnostic evaluation thatmay be needed or ordered today. Porshia verbalizes understanding of the findings of todays visit. We also reviewed her medications today and discussed drug interactions and side effects including but not limited excessive  drowsiness and altered mental states. We also discussed that there is always a risk not just to her but also people around her. she has been encouraged to call the office with any questions or concerns that should arise related to todays visit.  No orders of the defined types were placed in this encounter.       I have personally obtained a history, evaluated the patient, evaluated pertinent data, formulated the assessment and plan and placed orders.  This patient was seen by Lynn Ito, PA-C in collaboration with Dr. Freda Munro as a part of collaborative care agreement.    Yevonne Pax, MD [facility] Diplomate ABMS Pulmonary and Critical Care Medicine Sleep medicine

## 2022-11-02 ENCOUNTER — Ambulatory Visit (INDEPENDENT_AMBULATORY_CARE_PROVIDER_SITE_OTHER): Payer: 59 | Admitting: Internal Medicine

## 2022-11-02 VITALS — BP 148/103 | HR 83 | Resp 18 | Ht 62.0 in | Wt 174.0 lb

## 2022-11-02 DIAGNOSIS — I10 Essential (primary) hypertension: Secondary | ICD-10-CM

## 2022-11-02 DIAGNOSIS — E669 Obesity, unspecified: Secondary | ICD-10-CM

## 2022-11-02 DIAGNOSIS — G4710 Hypersomnia, unspecified: Secondary | ICD-10-CM

## 2022-11-04 ENCOUNTER — Ambulatory Visit: Payer: 59

## 2022-11-28 ENCOUNTER — Encounter (INDEPENDENT_AMBULATORY_CARE_PROVIDER_SITE_OTHER): Payer: 59 | Admitting: Internal Medicine

## 2022-11-28 DIAGNOSIS — G4719 Other hypersomnia: Secondary | ICD-10-CM

## 2022-12-13 NOTE — Procedures (Signed)
[facility]  Polysomnogram Report Part I                                                               Phone: [phone] Fax: [phone]  Patient Name: Cindy King, Cindy King Acquisition Number: [id number]  Date of Birth: 04/04/1981 Acquisition Date: 11/28/2022  Referring Physician: Marya Fossa, PA     History: The patient is a 42 year old  who was referred for evaluation of . Medical History: hypersomnia, hypertension, obesity,   Medications: levonorgestrel, hydrochlorothiazide.  Procedure: This routine overnight polysomnogram was performed on the Alice 5 using the standard diagnostic protocol. This included 6 channels of EEG, 2 channels of EOG, chin EMG, bilateral anterior tibialis EMG, nasal/oral thermistor, PTAF (nasal pressure transducer), chest and abdominal wall movements, EKG, and pulse oximetry.  Description: The total recording time was 393.5 minutes. The total sleep time was 377.0 minutes. There were a total of 8.5 minutes of wakefulness after sleep onset for a very goodsleep efficiency of 95.8%. The latency to sleep onset was shortat 8.0 minutes. The R sleep onset latency was within normal limits at 64.5 minutes. Sleep parameters, as a percentage of the total sleep time, demonstrated 2.4% of sleep was in N1 sleep, 64.1% N2, 12.1% N3 and 21.5% R sleep. There were a total of 45 arousals for an arousal index of 7.2 arousals per hour of sleep that was normal.  Respiratory monitoring demonstrated   snoring. All sleep was in the supine position. There were 171 apneas and hypopneas for an Apnea Hypopnea Index of 27.2 apneas and hypopneas per hour of sleep. The REM related apnea hypopnea index was 67.4/hr of REM sleep compared to a NREM AHI of 16.2/hr.  The average duration of the respiratory events was 30.7 seconds with a maximum duration of 78.0 seconds. The respiratory events were associated with peripheral oxygen desaturations on the average to 87%. The lowest oxygen desaturation  associated with a respiratory event was 82%. Additionally, the baseline oxygen saturation during wakefulness was 91%, during NREM sleep averaged 91%, and during REM sleep averaged 93%. The total duration of oxygen < 90% was 68.1 minutes and <80% was 0.0 minutes.  Cardiac monitoring-  demonstrate transient cardiac decelerations associated with the apneas.  significant cardiac rhythm irregularities.   Periodic limb movement monitoring- demonstrated that there were 64 periodic limb movements for a periodic limb movement index of 10.2 periodic limb movements per hour of sleep.   Impression: This routine overnight polysomnogram demonstrated significant obstructive sleep apnea with an overall Apnea Hypopnea Index of 27.2 apneas and hypopneas per hour of sleep. The respiratory events were more frequent in REM sleep with an AHI of 67.2. The lowest desaturation was to 82%. All sleep was in the supine position.   There was a slightly elevated periodic limb movement index of 10.2 periodic limb movements per hour of sleep. Sometimes these limb movements subside once the apnea is controlled. Clinical correlation is suggested.   Slow wave percentage was reduced, likely due to the obstructive sleep apnea.  Recommendations:    A CPAP titration would be recommended due to the severity of the sleep apnea. Some supine sleep should be ensured to optimize the titration. Additionally, would recommend weight loss in a patient with a BMI of 32.9.  Cindy Pax, MD, [facility] Diplomate ABMS-Pulmonary, Critical Care and Sleep Medicine  Electronically reviewed and digitally signed  [facility] Polysomnogram Report Part II  Phone: [phone] Fax: [phone]  Patient last name Cindy King Neck Size 15.0 in. Acquisition [phone]  Patient first name [name] Weight 174.0 lbs. Started 11/28/2022 at 10:37:11 PM  Birth date 1980-11-20 Height 61.0 in. Stopped 11/29/2022 at 5:24:35 AM  Age 10 BMI 32.9 lb/in2 Duration  393.5  Study Type Adult      Cindy King, RPSGT/Cindy King  Reviewed by: Cindy Hue. Henke, Cindy King, Cindy King, Cindy King Sleep Data: Lights Out: 10:45:41 PM Sleep Onset: 10:53:41 PM  Lights On: 5:19:11 AM Sleep Efficiency: 95.8 %  Total Recording Time: 393.5 min Sleep Latency (from Lights Off) 8.0 min  Total Sleep Time (TST): 377.0 min R Latency (from Sleep Onset): 64.5 min  Sleep Period Time: 384.0 min Total number of awakenings: 12  Wake during sleep: 7.0 min Wake After Sleep Onset (WASO): 8.5 min   Sleep Data:         Arousal Summary: Stage  Latency from lights out (min) Latency from sleep onset (min) Duration (min) % Total Sleep Time  Normal values  N 1 8.0 0.0 9.0 2.4 (5%)  N 2 8.5 0.5 241.5 64.1 (50%)  N 3 38.0 30.0 45.5 12.1 (20%)  R 72.5 64.5 81.0 21.5 (25%)   Number Index  Spontaneous 39 6.2  Apneas & Hypopneas 12 1.9  RERAs 0 0.0       (Apneas & Hypopneas & RERAs)  (12) (1.9)  Limb Movement 2 0.3  Snore 0 0.0  TOTAL 53 8.4     Respiratory Data:  CA OA MA Apnea Hypopnea* A+ H RERA Total  Number 0 37 0 37 134 171 0 171  Mean Dur (sec) 0.0 17.0 0.0 17.0 34.5 30.7 0.0 30.7  Max Dur (sec) 0.0 29.0 0.0 29.0 78.0 78.0 0.0 78.0  Total Dur (min) 0.0 10.5 0.0 10.5 77.0 87.4 0.0 87.4  % of TST 0.0 2.8 0.0 2.8 20.4 23.2 0.0 23.2  Index (#/h TST) 0.0 5.9 0.0 5.9 21.3 27.2 0.0 27.2  *Hypopneas scored based on 4% or greater desaturation.  Sleep Stage:        REM NREM TST  AHI 67.4 16.2 27.2  RDI 67.4 16.2 27.2           Body Position Data:  Sleep (min) TST (%) REM (min) NREM (min) CA (#) OA (#) MA (#) HYP (#) AHI (#/h) RERA (#) RDI (#/h) Desat (#)  Supine 377.0 100.00 81.0 296.0 0 37 0 134 27.2 0 27.2 309  Non-Supine 0.00 0.00 0.00 0.00 0.00 0.00 0.00 0.00 0.00 0 0.00 0.00     Snoring: Total number of snoring episodes  0  Total time with snoring    min (   % of sleep)   Oximetry Distribution:             WK REM NREM TOTAL  Average (%)   91 93 91 91  < 90%  4.1 8.2 55.8 68.1  < 80% 0.0 0.0 0.0 0.0  < 70% 0.0 0.0 0.0 0.0  # of Desaturations* 12 109 188 309  Desat Index (#/hour) 44.4 81.8 38.1 49.3  Desat Max (%) 12 12 14 14   Desat Max Dur (sec) 30.0 33.0 87.0 87.0  Approx Min O2 during sleep 82  Approx min O2 during a respiratory event 82  Was Oxygen added (Y/N) and final rate :  LPM  *Desaturations based on 3% or greater drop from baseline.   Cheyne Stokes Breathing: None Present    Heart Rate Summary:  Average Heart Rate During Sleep 63.8 bpm      Highest Heart Rate During Sleep (95th %) 72.0 bpm      Highest Heart Rate During Sleep 98 bpm      Highest Heart Rate During Recording (TIB) 125 bpm       Heart Rate Observations: Event Type # Events   Bradycardia 0 Lowest HR Scored: N/A  Sinus Tachycardia During Sleep 0 Highest HR Scored: N/A  Narrow Complex Tachycardia 0 Highest HR Scored: N/A  Wide Complex Tachycardia 0 Highest HR Scored: N/A  Asystole 0 Longest Pause: N/A  Atrial Fibrillation 0 Duration Longest Event: N/A  Other Arrythmias   Type:    Periodic Limb Movement Data: (Primary legs unless otherwise noted) Total # Limb Movement 64 Limb Movement Index 10.2  Total # PLMS 64 PLMS Index 10.2  Total # PLMS Arousals 2 PLMS Arousal Index 0.3  Percentage Sleep Time with PLMS 38.52min (10.2 % sleep)  Mean Duration limb movements (secs) 767.0

## 2023-01-04 ENCOUNTER — Encounter (INDEPENDENT_AMBULATORY_CARE_PROVIDER_SITE_OTHER): Payer: 59 | Admitting: Internal Medicine

## 2023-01-04 DIAGNOSIS — G4733 Obstructive sleep apnea (adult) (pediatric): Secondary | ICD-10-CM

## 2023-01-10 NOTE — Procedures (Signed)
[facility]  Polysomnogram Report Part I  Phone: [phone] Fax: [phone]  Patient Name: Cindy King, Cindy King Acquisition Number: [id number]  Date of Birth: 1981-01-12 Acquisition Date: 01/04/2023  Referring Physician: Marya Fossa, PA     History: The patient is a 42 year old  . Medical History: hypersomnia, hypertension, obesity.  Medications: levonorgestrel, hydrochlorothiazide.  Procedure: This routine overnight polysomnogram was performed on the Alice 5 using the standard CPAP protocol. This included 6 channels of EEG, 2 channels of EOG, chin EMG, bilateral anterior tibialis EMG, nasal/oral thermistor, PTAF (nasal pressure transducer), chest and abdominal wall movements, EKG, and pulse oximetry.  Description: The total recording time was 426.5 minutes. The total sleep time was 417.5 minutes. There were a total of 7.5 minutes of wakefulness after sleep onset for avery goodsleep efficiency of 97.9%. The latency to sleep onset was shortat 1.5 minutes. The R sleep onset latency was within normal limits at 97.5 minutes. Sleep parameters, as a percentage of the total sleep time, demonstrated 1.1% of sleep was in N1 sleep, 62.0% N2, 13.7% N3 and 23.2% R sleep. There were a total of 19 arousals for an arousal index of 2.7 arousals per hour of sleep that was normal.  Overall, there were a total of 8 respiratory events for a respiratory disturbance index, which includes apneas, hypopneas and RERAs (increased respiratory effort) of 1.1 respiratory events per hour of sleep during the pressure titration.  was initiated at 4 cm H2O at lights out, 10:30 p.m. It was titrated in 1-2 cm increments for intermittent respiratory events to the final pressure of 7 cm H2O. The apnea was controlled at this pressure and REM sleep was observed. All sleep was in the supine position.   Additionally, the baseline oxygen saturation during wakefulness was 98%, during NREM sleep averaged 97%, and during REM sleep  averaged 98%. The total duration of oxygen < 90% was 0.0 minutes.  Cardiac monitoring-  significant cardiac rhythm irregularities.   Periodic limb movement monitoring- demonstrated that there were 30 periodic limb movements for a periodic limb movement index of 4.3 periodic limb movements per hour of sleep.   Impression: This patient's obstructive sleep apnea demonstrated significant improvement with the utilization of nasal   at 7 cm H2O.   There were few periodic limb movements that commonly are not significant. Clinical correlation would be suggested.   Recommendations: Would recommend utilization of nasal  at 7 cm H2O.      A small Airfit N2O mask was used. Chin strap used during study- no. Humidifier used during study- yes.     Yevonne Pax, MD, [facility] Diplomate ABMS-Pulmonary, Critical Care and Sleep Medicine  Electronically reviewed and digitally signed   [facility] CPAP/BIPAP Polysomnogram Report Part II Phone: [phone] Fax: [phone]  Patient last name [facility] Neck Size 16.0 in. Acquisition [phone]  Patient first name [name] Weight 174.0 lbs. Started 01/04/2023 at 10:30:22 PM  Birth date [birth year] Height 62.0 in. Stopped 01/05/2023 at 5:40:40 AM  Age 55      Type Adult BMI 31.8 lb/in2 Duration 426.5  Robbi Garter, RPSGT & Ja'Net Rennis Harding.  Reviewed by: Valentino Hue Henke, PhD, ABSM, FAASM Sleep Data: Lights Out: 10:30:22 PM Sleep Onset: 10:31:52 PM  Lights On: 5:36:52 AM Sleep Efficiency: 97.9 %  Total Recording Time: 426.5 min Sleep Latency (from Lights Off) 1.5 min  Total Sleep Time (TST): 417.5 min R Latency (from Sleep Onset): 97.5 min  Sleep Period Time: 425.0 min Total  number of awakenings: 10  Wake during sleep: 7.5 min Wake After Sleep Onset (WASO): 7.5 min   Sleep Data:         Arousal Summary: Stage  Latency from lights out (min) Latency from sleep onset (min) Duration (min) % Total Sleep Time  Normal values  N 1 39.0 37.5 4.5 1.1 (5%)  N  2 1.5 0.0 259.0 62.0 (50%)  N 3 35.5 34.0 57.0 13.7 (20%)  R 99.0 97.5 97.0 23.2 (25%)   Number Index  Spontaneous 13 1.9  Apneas & Hypopneas 0 0.0  RERAs 0 0.0       (Apneas & Hypopneas & RERAs)  (0) (0.0)  Limb Movement 8 1.1  Snore 0 0.0  TOTAL 21 3.0     Respiratory Data:  CA OA MA Apnea Hypopnea* A+ H RERA Total  Number 0 8 0 8 0 8 0 8  Mean Dur (sec) 0.0 11.4 0.0 11.4 0.0 11.4 0.0 11.4  Max Dur (sec) 0.0 15.0 0.0 15.0 0.0 15.0 0.0 15.0  Total Dur (min) 0.0 1.5 0.0 1.5 0.0 1.5 0.0 1.5  % of TST 0.0 0.4 0.0 0.4 0.0 0.4 0.0 0.4  Index (#/h TST) 0.0 1.1 0.0 1.1 0.0 1.1 0.0 1.1  *Hypopneas scored based on 4% or greater desaturation.  Sleep Stage:         REM NREM TST  AHI 3.7 0.2 1.1  RDI 3.7 0.2 1.1   Sleep (min) TST (%) REM (min) NREM (min) CA (#) OA (#) MA (#) HYP (#) AHI (#/h) RERA (#) RDI (#/h) Desat (#)  Supine 417.5 100.00 97.0 320.5 0 8 0 0 1.1 0 1.1 14  Non-Supine 0.00 0.00 0.00 0.00 0.00 0.00 0.00 0.00 0.00 0 0.00 0.00     Snoring: Total number of snoring episodes  0  Total time with snoring    min (   % of sleep)   Oximetry Distribution:             WK REM NREM TOTAL  Average (%)   98 98 97 97  < 90% 0.0 0.0 0.0 0.0  < 80% 0.0 0.0 0.0 0.0  < 70% 0.0 0.0 0.0 0.0  # of Desaturations* 0 9 5 14   Desat Index (#/hour) 0.0 5.6 0.9 2.0  Desat Max (%) 0 8 7 8   Desat Max Dur (sec) 0.0 85.0 34.0 85.0  Approx Min O2 during sleep 91  Approx min O2 during a respiratory event 91  Was Oxygen added (Y/N) and final rate :    LPM  *Desaturations based on 4% or greater drop from baseline.   Cheyne Stokes Breathing: None Present   Heart Rate Summary:  Average Heart Rate During Sleep 64.1 bpm      Highest Heart Rate During Sleep (95th %) 73.0 bpm      Highest Heart Rate During Sleep 163 bpm (artifact)  Highest Heart Rate During Recording (TIB) 189 bpm (artifact)   Heart Rate Observations: Event Type # Events   Bradycardia 0 Lowest HR Scored: N/A   Sinus Tachycardia During Sleep 0 Highest HR Scored: N/A  Narrow Complex Tachycardia 0 Highest HR Scored: N/A  Wide Complex Tachycardia 0 Highest HR Scored: N/A  Asystole 0 Longest Pause: N/A  Atrial Fibrillation 0 Duration Longest Event: N/A  Other Arrythmias   Type:   Periodic Limb Movement Data: (Primary legs unless otherwise noted) Total # Limb Movement 57 Limb Movement Index 8.2  Total # PLMS 30 PLMS  Index 4.3  Total # PLMS Arousals    PLMS Arousal Index     Percentage Sleep Time with PLMS 21.10min (5.2 % sleep)  Mean Duration limb movements (secs) 260.5    IPAP Level (cmH2O) EPAP Level (cmH2O) Total Duration (min) Sleep Duration (min) Sleep (%) REM (%) CA  #) OA # MA # HYP #) AHI (#/hr) RERAs # RERAs (#/hr) RDI (#/hr)  4 4 28.2 28.2 100.0 0.0 0 0 0 0 0.0 0 0.0 0.0  5 5 260.9 256.4 98.3 25.6 0 5 0 0 1.2 0 0.0 1.2  7 7  135.3 132.3 97.8 22.0 0 3 0 0 1.4 0 0.0 1.4

## 2023-05-01 ENCOUNTER — Ambulatory Visit: Payer: 59

## 2023-08-21 ENCOUNTER — Ambulatory Visit (INDEPENDENT_AMBULATORY_CARE_PROVIDER_SITE_OTHER): Payer: 59 | Admitting: Internal Medicine

## 2023-08-21 VITALS — BP 130/94 | HR 96 | Resp 16 | Ht 62.0 in | Wt 176.0 lb

## 2023-08-21 DIAGNOSIS — G4733 Obstructive sleep apnea (adult) (pediatric): Secondary | ICD-10-CM | POA: Insufficient documentation

## 2023-08-21 DIAGNOSIS — Z7189 Other specified counseling: Secondary | ICD-10-CM | POA: Diagnosis not present

## 2023-08-21 NOTE — Progress Notes (Signed)
 [facility] [address] [place], [place] [address]  Pulmonary Sleep Medicine   Office Visit Note  Patient Name: Cindy King DOB: January 02, 1981 MRN [id number]    Chief Complaint: Obstructive Sleep Apnea visit  Brief History:  Cindy King is seen today for a follow up visit for CPAP@ 7 cmH2O. The patient has a 8 month history of sleep apnea. Patient is not using PAP nightly.  The patient feels rested after sleeping with PAP.  The patient reports benefiting from PAP use. Reported sleepiness is  improved and the Epworth Sleepiness Score is 4 out of 24. The patient will take naps daily. The patient complains of the following: pt is in need of new supplies.  The compliance download shows 20% compliance with an average use time of 6 hours 7 minutes. The AHI is 0.5.  The patient does not complain of limb movements disrupting sleep. The patient continues to require PAP therapy in order to eliminate sleep apnea.   ROS  General: (-) fever, (-) chills, (-) night sweat Nose and Sinuses: (-) nasal stuffiness or itchiness, (-) postnasal drip, (-) nosebleeds, (-) sinus trouble. Mouth and Throat: (-) sore throat, (-) hoarseness. Neck: (-) swollen glands, (-) enlarged thyroid, (-) neck pain. Respiratory: - cough, + shortness of breath, - wheezing. Neurologic: - numbness, - tingling. Psychiatric: - anxiety, - depression   Current Medication: Outpatient Encounter Medications as of 08/21/2023  Medication Sig   hydrochlorothiazide (HYDRODIURIL) 25 MG tablet Take 25 mg by mouth daily.   levonorgestrel (MIRENA) 20 MCG/DAY IUD 1 each by Intrauterine route once.   No facility-administered encounter medications on file as of 08/21/2023.    Surgical History: Past Surgical History:  Procedure Laterality Date   NO PAST SURGERIES      Medical History: Past Medical History:  Diagnosis Date   Frequent headaches     Family History: Non contributory to the present illness  Social  History: Social History   Socioeconomic History   Marital status: Married    Spouse name: Not on file   Number of children: Not on file   Years of education: Not on file   Highest education level: Not on file  Occupational History   Not on file  Tobacco Use   Smoking status: Never   Smokeless tobacco: Never  Substance and Sexual Activity   Alcohol use: Yes    Alcohol/week: 0.0 - 1.0 standard drinks of alcohol   Drug use: No   Sexual activity: Yes  Other Topics Concern   Not on file  Social History Narrative   Not on file   Social Drivers of Health   Financial Resource Strain: Not on file  Food Insecurity: Not on file  Transportation Needs: Not on file  Physical Activity: Not on file  Stress: Not on file  Social Connections: Not on file  Intimate Partner Violence: Not on file    Vital Signs: Blood pressure (!) 130/94, pulse 96, resp. rate 16, height 5\' 2"  (1.575 m), weight 176 lb (79.8 kg), SpO2 98%. Body mass index is 32.19 kg/m.    Examination: General Appearance: The patient is well-developed, well-nourished, and in no distress. Neck Circumference: 36 cm Skin: Gross inspection of skin unremarkable. Head: normocephalic, no gross deformities. Eyes: no gross deformities noted. ENT: ears appear grossly normal Neurologic: Alert and oriented. No involuntary movements.  STOP BANG RISK ASSESSMENT S (snore) Have you been told that you snore?     YES   T (tired) Are you often tired,  fatigued, or sleepy during the day?   YES  O (obstruction) Do you stop breathing, choke, or gasp during sleep? NO   P (pressure) Do you have or are you being treated for high blood pressure? YES   B (BMI) Is your body index greater than 35 kg/m? NO   A (age) Are you 74 years old or older? NO   N (neck) Do you have a neck circumference greater than 16 inches?   NO   G (gender) Are you a female? NO   TOTAL STOP/BANG "YES" ANSWERS 4       A STOP-Bang score of 2 or less is  considered low risk, and a score of 5 or more is high risk for having either moderate or severe OSA. For people who score 3 or 4, doctors may need to perform further assessment to determine how likely they are to have OSA.         EPWORTH SLEEPINESS SCALE:  Scale:  (0)= no chance of dozing; (1)= slight chance of dozing; (2)= moderate chance of dozing; (3)= high chance of dozing  Chance  Situtation    Sitting and reading: 1    Watching TV: 1    Sitting Inactive in public: 0    As a passenger in car: 1      Lying down to rest: 1    Sitting and talking: 0    Sitting quielty after lunch: 0    In a car, stopped in traffic: 0   TOTAL SCORE:   4 out of 24    SLEEP STUDIES:  PSG (11/2022) AHI 27/hr, REM AHI 67/hr, min SpO2 82% Titration (12/2022) CPAP@ 7 cmH2O   CPAP COMPLIANCE DATA:  Date Range: 02/01/2023-08/18/2023  Average Daily Use: 6 hours 7 minutes  Median Use: 6 hours 34 minutes  Compliance for > 4 Hours: 20%  AHI: 0.5 respiratory events per hour  Days Used: 45/199 days  Mask Leak: 3.9  95th Percentile Pressure: 7         LABS: No results found for this or any previous visit (from the past 2160 hours).  Radiology: CT Head Wo Contrast Result Date: 09/28/2020 CLINICAL DATA:  Right facial numbness beginning this morning. Generalized weakness. EXAM: CT HEAD WITHOUT CONTRAST TECHNIQUE: Contiguous axial images were obtained from the base of the skull through the vertex without intravenous contrast. COMPARISON:  None. FINDINGS: Brain: There is no evidence of an acute infarct, intracranial hemorrhage, mass, midline shift, or extra-axial fluid collection. The ventricles and sulci are normal. Vascular: No hyperdense vessel. Skull: No fracture or suspicious osseous lesion. Sinuses/Orbits: Visualized paranasal sinuses and mastoid air cells are clear. Visualized orbits are unremarkable. Other: None. IMPRESSION: Negative head CT. Electronically Signed   By: Sebastian Ache M.D.   On: 09/28/2020 16:26    No results found.  No results found.    Assessment and Plan: Patient Active Problem List   Diagnosis Date Noted   OSA (obstructive sleep apnea) 08/21/2023   CPAP use counseling 08/21/2023   Benign essential HTN 11/21/2019   Headache 12/06/2015   Encounter for preventative adult health care exam with abnormal findings 12/06/2015   1. OSA (obstructive sleep apnea) (Primary) The patient does tolerate PAP and reports  benefit from PAP use. She has had some skin irritation due to not changing out her seal. She will do that and resume use. The patient was reminded how to clean equipment and advised to replace supplies more frequently. The patient was  also counselled on weight loss. The compliance is poor. The AHI is 0.5.   OSA on cpap- controlled but not compliant. Increase compliance. . CPAP continues to be medically necessary to treat this patient's OSA. F/u 69m  2. CPAP use counseling CPAP Counseling: had a lengthy discussion with the patient regarding the importance of PAP therapy in management of the sleep apnea. Patient appears to understand the risk factor reduction and also understands the risks associated with untreated sleep apnea. Patient will try to make a good faith effort to remain compliant with therapy. Also instructed the patient on proper cleaning of the device including the water must be changed daily if possible and use of distilled water is preferred. Patient understands that the machine should be regularly cleaned with appropriate recommended cleaning solutions that do not damage the PAP machine for example given white vinegar and water rinses. Other methods such as ozone treatment may not be as good as these simple methods to achieve cleaning.     General Counseling: I have discussed the findings of the evaluation and examination with Cindy King.  I have also discussed any further diagnostic evaluation thatmay be needed or ordered today.  Cindy King verbalizes understanding of the findings of todays visit. We also reviewed her medications today and discussed drug interactions and side effects including but not limited excessive drowsiness and altered mental states. We also discussed that there is always a risk not just to her but also people around her. she has been encouraged to call the office with any questions or concerns that should arise related to todays visit.  No orders of the defined types were placed in this encounter.       I have personally obtained a history, examined the patient, evaluated laboratory and imaging results, formulated the assessment and plan and placed orders. This patient was seen today by Emmaline Kluver, PA-C in collaboration with Dr. Freda Munro.   Yevonne Pax, MD [facility] Diplomate ABMS Pulmonary Critical Care Medicine and Sleep Medicine

## 2023-08-21 NOTE — Patient Instructions (Signed)

## 2023-12-15 NOTE — Progress Notes (Signed)
 [facility] [address] [place], [place] [address]  Pulmonary Sleep Medicine   Office Visit Note  Patient Name: Cindy King DOB: 05/09/1981 MRN [id number]    Chief Complaint: Obstructive Sleep Apnea visit  Brief History:  Cindy King is seen today for a 4 month follow up visit for CPAP@ 7 cmH2O. The patient has a 1 year history of sleep apnea. Patient is not using PAP nightly.  The patient feels rested after sleeping with PAP.  The patient reports benefiting from PAP use. Reported sleepiness is  improved and the Epworth Sleepiness Score is 5 out of 24. The patient will sometimes take naps. The patient complains of the following: none.  The compliance download shows 21% compliance with an average use time of 5 hours 50 minutes. The AHI is 0.6.  The patient does not complain of limb movements disrupting sleep. The patient continues to require PAP therapy in order to eliminate sleep apnea.   ROS  General: (-) fever, (-) chills, (-) night sweat Nose and Sinuses: (-) nasal stuffiness or itchiness, (-) postnasal drip, (-) nosebleeds, (-) sinus trouble. Mouth and Throat: (-) sore throat, (-) hoarseness. Neck: (-) swollen glands, (-) enlarged thyroid, (-) neck pain. Respiratory: - cough, - shortness of breath, - wheezing. Neurologic: - numbness, - tingling. Psychiatric: - anxiety, - depression   Current Medication: Outpatient Encounter Medications as of 12/18/2023  Medication Sig   amLODipine (NORVASC) 5 MG tablet Take 5 mg by mouth daily.   cyclobenzaprine (FLEXERIL) 10 MG tablet Take 10 mg by mouth at bedtime as needed.   hydrochlorothiazide (HYDRODIURIL) 25 MG tablet Take 25 mg by mouth daily.   levonorgestrel (MIRENA) 20 MCG/DAY IUD 1 each by Intrauterine route once.   No facility-administered encounter medications on file as of 12/18/2023.    Surgical History: Past Surgical History:  Procedure Laterality Date   NO PAST SURGERIES      Medical History: Past  Medical History:  Diagnosis Date   Frequent headaches     Family History: Non contributory to the present illness  Social History: Social History   Socioeconomic History   Marital status: Married    Spouse name: Not on file   Number of children: Not on file   Years of education: Not on file   Highest education level: Not on file  Occupational History   Not on file  Tobacco Use   Smoking status: Never   Smokeless tobacco: Never  Substance and Sexual Activity   Alcohol use: Yes    Alcohol/week: 0.0 - 1.0 standard drinks of alcohol   Drug use: No   Sexual activity: Yes  Other Topics Concern   Not on file  Social History Narrative   Not on file   Social Drivers of Health   Financial Resource Strain: Not on file  Food Insecurity: Not on file  Transportation Needs: Not on file  Physical Activity: Not on file  Stress: Not on file  Social Connections: Not on file  Intimate Partner Violence: Not on file    Vital Signs: Blood pressure (!) 137/98, pulse 88, resp. rate 16, height 5' 2 (1.575 m), weight 179 lb (81.2 kg), SpO2 98%. Body mass index is 32.74 kg/m.    Examination: General Appearance: The patient is well-developed, well-nourished, and in no distress. Neck Circumference: 36 cm Skin: Gross inspection of skin unremarkable. Head: normocephalic, no gross deformities. Eyes: no gross deformities noted. ENT: ears appear grossly normal Neurologic: Alert and oriented. No involuntary movements.  STOP  BANG RISK ASSESSMENT S (snore) Have you been told that you snore?     YES   T (tired) Are you often tired, fatigued, or sleepy during the day?   NO  O (obstruction) Do you stop breathing, choke, or gasp during sleep? NO   P (pressure) Do you have or are you being treated for high blood pressure? YES   B (BMI) Is your body index greater than 35 kg/m? NO   A (age) Are you 4 years old or older? NO   N (neck) Do you have a neck circumference greater than 16  inches?   NO   G (gender) Are you a female? NO   TOTAL STOP/BANG "YES" ANSWERS 2       A STOP-Bang score of 2 or less is considered low risk, and a score of 5 or more is high risk for having either moderate or severe OSA. For people who score 3 or 4, doctors may need to perform further assessment to determine how likely they are to have OSA.         EPWORTH SLEEPINESS SCALE:  Scale:  (0)= no chance of dozing; (1)= slight chance of dozing; (2)= moderate chance of dozing; (3)= high chance of dozing  Chance  Situtation    Sitting and reading: 1    Watching TV: 1    Sitting Inactive in public: 0    As a passenger in car: 2      Lying down to rest: 1    Sitting and talking: 0    Sitting quielty after lunch: 0    In a car, stopped in traffic: 0   TOTAL SCORE:   5 out of 24    SLEEP STUDIES:  PSG (11/2022) AHI 27/hr, REM AHI 67/hr, min SpO2 82% Titration (12/2022) CPAP@ 7 cmH2O   CPAP COMPLIANCE DATA:  Date Range: 02/01/2023-12/14/2023  Average Daily Use: 5 hours 50 minutes  Median Use: 6 hours 20 minutes  Compliance for > 4 Hours: 21%  AHI: 0.6 respiratory events per hour  Days Used: 80/317 days  Mask Leak: 7.2  95th Percentile Pressure: 7         LABS: No results found for this or any previous visit (from the past 2160 hours).  Radiology: CT Head Wo Contrast Result Date: 09/28/2020 CLINICAL DATA:  Right facial numbness beginning this morning. Generalized weakness. EXAM: CT HEAD WITHOUT CONTRAST TECHNIQUE: Contiguous axial images were obtained from the base of the skull through the vertex without intravenous contrast. COMPARISON:  None. FINDINGS: Brain: There is no evidence of an acute infarct, intracranial hemorrhage, mass, midline shift, or extra-axial fluid collection. The ventricles and sulci are normal. Vascular: No hyperdense vessel. Skull: No fracture or suspicious osseous lesion. Sinuses/Orbits: Visualized paranasal sinuses and mastoid air  cells are clear. Visualized orbits are unremarkable. Other: None. IMPRESSION: Negative head CT. Electronically Signed   By: Dasie Hamburg M.D.   On: 09/28/2020 16:26    No results found.  No results found.    Assessment and Plan: Patient Active Problem List   Diagnosis Date Noted   OSA (obstructive sleep apnea) 08/21/2023   CPAP use counseling 08/21/2023   Benign essential HTN 11/21/2019   Headache 12/06/2015   Encounter for preventative adult health care exam with abnormal findings 12/06/2015    1. OSA (obstructive sleep apnea) (Primary) The patient does tolerate PAP and reports  benefit from PAP use. We discussed her inconsistent use of cpap and she admits that she  uses it when her husband is home on the weekends and not in between. I explained the risks of untreated apnea and the patient agrees she will try to increase her usage.  The patient was reminded how to clean equipment and advised to replace supplies routinely. The patient was also counselled on weight loss. The compliance is poor. The AHI is 0.6.   OSA on cpap- controlled. Increase compliance with pap. CPAP continues to be medically necessary to treat this patient's OSA. F/u one year.    2. CPAP use counseling CPAP Counseling: had a lengthy discussion with the patient regarding the importance of PAP therapy in management of the sleep apnea. Patient appears to understand the risk factor reduction and also understands the risks associated with untreated sleep apnea. Patient will try to make a good faith effort to remain compliant with therapy. Also instructed the patient on proper cleaning of the device including the water must be changed daily if possible and use of distilled water is preferred. Patient understands that the machine should be regularly cleaned with appropriate recommended cleaning solutions that do not damage the PAP machine for example given white vinegar and water rinses. Other methods such as ozone treatment  may not be as good as these simple methods to achieve cleaning.   3. Obesity (BMI 30.0-34.9) Obesity Counseling: Had a lengthy discussion regarding patients BMI and weight issues. Patient was instructed on portion control as well as increased activity. Also discussed caloric restrictions with trying to maintain intake less than 2000 Kcal. Discussions were made in accordance with the 5As of weight management. Simple actions such as not eating late and if able to, taking a walk is suggested.     General Counseling: I have discussed the findings of the evaluation and examination with Cindy King.  I have also discussed any further diagnostic evaluation thatmay be needed or ordered today. Cindy King verbalizes understanding of the findings of todays visit. We also reviewed her medications today and discussed drug interactions and side effects including but not limited excessive drowsiness and altered mental states. We also discussed that there is always a risk not just to her but also people around her. she has been encouraged to call the office with any questions or concerns that should arise related to todays visit.  No orders of the defined types were placed in this encounter.       I have personally obtained a history, examined the patient, evaluated laboratory and imaging results, formulated the assessment and plan and placed orders. This patient was seen today by Lauraine Lay, PA-C in collaboration with Dr. Elfreda Bathe.   Elfreda DELENA Bathe, MD [facility] Diplomate ABMS Pulmonary Critical Care Medicine and Sleep Medicine

## 2023-12-18 ENCOUNTER — Ambulatory Visit (INDEPENDENT_AMBULATORY_CARE_PROVIDER_SITE_OTHER): Admitting: Internal Medicine

## 2023-12-18 VITALS — BP 137/98 | HR 88 | Resp 16 | Ht 62.0 in | Wt 179.0 lb

## 2023-12-18 DIAGNOSIS — G4733 Obstructive sleep apnea (adult) (pediatric): Secondary | ICD-10-CM | POA: Diagnosis not present

## 2023-12-18 DIAGNOSIS — E66811 Obesity, class 1: Secondary | ICD-10-CM

## 2023-12-18 DIAGNOSIS — Z7189 Other specified counseling: Secondary | ICD-10-CM

## 2023-12-18 NOTE — Patient Instructions (Signed)

## 2023-12-22 ENCOUNTER — Other Ambulatory Visit: Payer: Self-pay | Admitting: Physician Assistant

## 2023-12-22 DIAGNOSIS — Z1231 Encounter for screening mammogram for malignant neoplasm of breast: Secondary | ICD-10-CM
